# Patient Record
Sex: Male | Born: 1995 | Race: White | Hispanic: No | Marital: Single | Smoking: Current every day smoker
Health system: Southern US, Community
[De-identification: ages and names within clinical notes are randomized; demographics above are authoritative.]

---
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
## 2022-12-25 ENCOUNTER — Emergency Department: Payer: BC Managed Care – PPO

## 2022-12-25 ENCOUNTER — Emergency Department
Admission: EM | Admit: 2022-12-25 | Discharge: 2022-12-25 | Disposition: A | Discharge status: Home / Self Care | Payer: BC Managed Care – PPO | Attending: Emergency Medicine | Admitting: Emergency Medicine

## 2022-12-25 ENCOUNTER — Other Ambulatory Visit: Payer: Self-pay

## 2022-12-25 DIAGNOSIS — S81812A Laceration without foreign body, left lower leg, initial encounter: Secondary | ICD-10-CM | POA: Insufficient documentation

## 2022-12-25 DIAGNOSIS — Z23 Encounter for immunization: Secondary | ICD-10-CM | POA: Diagnosis not present

## 2022-12-25 DIAGNOSIS — Y92410 Unspecified street and highway as the place of occurrence of the external cause: Secondary | ICD-10-CM | POA: Insufficient documentation

## 2022-12-25 MED ORDER — LIDOCAINE HCL (PF) 1 % IJ SOLN
10.0000 mL | Freq: Once | INTRAMUSCULAR | Status: AC
  Administered 2022-12-25: 10 mL
  Filled 2022-12-25: qty 10

## 2022-12-25 MED ORDER — TETANUS-DIPHTH-ACELL PERTUSSIS 5-2.5-18.5 LF-MCG/0.5 IM SUSY
0.5000 mL | PREFILLED_SYRINGE | Freq: Once | INTRAMUSCULAR | Status: AC
  Administered 2022-12-25: 0.5 mL via INTRAMUSCULAR
  Filled 2022-12-25: qty 0.5

## 2022-12-25 MED ORDER — CEPHALEXIN 500 MG PO CAPS: 1000.0000 mg | ORAL_CAPSULE | Freq: Two times a day (BID) | ORAL | 0 refills | Status: AC
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
## 2023-01-06 ENCOUNTER — Emergency Department
Admission: EM | Admit: 2023-01-06 | Discharge: 2023-01-06 | Discharge status: Against Medical Advice | Payer: BC Managed Care – PPO | Attending: Emergency Medicine | Admitting: Emergency Medicine

## 2023-01-06 DIAGNOSIS — Z4802 Encounter for removal of sutures: Secondary | ICD-10-CM | POA: Diagnosis present

## 2023-01-06 DIAGNOSIS — Z5321 Procedure and treatment not carried out due to patient leaving prior to being seen by health care provider: Secondary | ICD-10-CM | POA: Insufficient documentation

## 2023-01-06 NOTE — ED Triage Notes (Signed)
Suture removal from left shin wound   Patient denies complaint.  Area clean and dry. NAD

## 2024-03-21 ENCOUNTER — Emergency Department: Admission: EM | Admit: 2024-03-21 | Discharge: 2024-03-21 | Disposition: A | Discharge status: Home / Self Care

## 2024-03-21 ENCOUNTER — Other Ambulatory Visit: Payer: Self-pay

## 2024-03-21 ENCOUNTER — Emergency Department

## 2024-03-21 DIAGNOSIS — R042 Hemoptysis: Secondary | ICD-10-CM | POA: Insufficient documentation

## 2024-03-21 NOTE — ED Triage Notes (Signed)
 Pt to ED via [facility] from home. Pt reports woke up this am with mucous in his throat and coughing up blood tinged sputum. Denies SOB.

## 2024-03-21 NOTE — ED Provider Notes (Signed)
 "  [facility] Provider Note    Event Date/Time   First MD Initiated Contact with Patient 03/21/24 1112     (approximate)   History   Hemoptysis   HPI  Walter Stokes is a 29 y.o. male with no significant past medical history presents emergency department complaining of hemoptysis this morning.  He had a lot of mucus in his throat and coughed up blood along with mucus.  Patient states it was not clumps of blood or coffee grounds just look like blood.  Patient does vape.  Denies fever or chills.  No gum bleeding or bruising that he is aware of.  Has never happened to him before.      Physical Exam   Triage Vital Signs: ED Triage Vitals [03/21/24 1014]  Encounter Vitals Group     BP 126/84     Girls Systolic BP Percentile      Girls Diastolic BP Percentile      Boys Systolic BP Percentile      Boys Diastolic BP Percentile      Pulse Rate 82     Resp 18     Temp 99.7 F (37.6 C)     Temp Source Oral     SpO2 98 %     Weight      Height      Head Circumference      Peak Flow      Pain Score 0     Pain Loc      Pain Education      Exclude from Growth Chart     Most recent vital signs: Vitals:   03/21/24 1014  BP: 126/84  Pulse: 82  Resp: 18  Temp: 99.7 F (37.6 C)  SpO2: 98%     General: Awake, no distress.   CV:  Good peripheral perfusion. regular rate and  rhythm Resp:  Normal effort. Lungs CTA, no crepitus noted at the chest or neck Abd:  No distention.   Other:      ED Results / Procedures / Treatments   Labs (all labs ordered are listed, but only abnormal results are displayed) Labs Reviewed - No data to display   EKG     RADIOLOGY Chest x-ray    PROCEDURES:   Procedures  Critical Care:  no Chief Complaint  Patient presents with   Hemoptysis      MEDICATIONS ORDERED IN ED: Medications - No data to display   IMPRESSION / MDM / ASSESSMENT AND PLAN / ED COURSE  I reviewed the triage vital  signs and the nursing notes.                              Differential diagnosis includes, but is not limited to, esophageal irritation, CAP, COVID, influenza, RSV, throat cancer, lung cancer  Patient's presentation is most consistent with acute illness / injury with system symptoms.   Patient's exam is reassuring.  Feel this could be a one-time event due to his vaping.  Explained to him that he should not vape as this could be causing him to cough up blood due to irritation.  Strict instructions to return emergency department if hemoptysis is worsening.  The patient is in agreement treatment plan.  Discharged stable condition.      FINAL CLINICAL IMPRESSION(S) / ED DIAGNOSES   Final diagnoses:  Hemoptysis     Rx / DC Orders   ED  Discharge Orders     None        Note:  This document was prepared using Dragon voice recognition software and may include unintentional dictation errors.    Gasper Devere ORN, PA-C 03/21/24 1145    Rexford Reche HERO, MD 03/21/24 1244  "

## 2024-03-21 NOTE — Discharge Instructions (Signed)
 Follow-up with regular doctor if not improving in 3 days.  Return emergency department for worsening. Do not vape or smoke.  This will irritate the lining of the bronchial tubes and esophagus to make you worse.  If you begin to cough up a larger amount of blood consistently should return the emergency department.

## 2024-03-21 NOTE — ED Notes (Signed)
49
